# Patient Record
Sex: Male | Born: 1960 | Race: White | Hispanic: No | Marital: Married | Smoking: Never smoker
Health system: Southern US, Community
[De-identification: ages and names within clinical notes are randomized; demographics above are authoritative.]

## PROBLEM LIST (undated history)

## (undated) HISTORY — PX: WISDOM TOOTH EXTRACTION: SHX21

---

## 2012-05-10 ENCOUNTER — Encounter: Payer: Self-pay | Admitting: Physician Assistant

## 2012-05-10 ENCOUNTER — Ambulatory Visit (INDEPENDENT_AMBULATORY_CARE_PROVIDER_SITE_OTHER): Payer: BC Managed Care – PPO | Admitting: Internal Medicine

## 2012-05-10 VITALS — BP 133/76 | HR 63 | Temp 97.2°F | Resp 16 | Ht 68.0 in | Wt 163.8 lb

## 2012-05-10 DIAGNOSIS — H01119 Allergic dermatitis of unspecified eye, unspecified eyelid: Secondary | ICD-10-CM

## 2012-05-10 DIAGNOSIS — Z0000 Encounter for general adult medical examination without abnormal findings: Secondary | ICD-10-CM

## 2012-05-10 LAB — CBC WITH DIFFERENTIAL/PLATELET
Basophils Relative: 0 % (ref 0–1)
Eosinophils Absolute: 0.2 10*3/uL (ref 0.0–0.7)
Eosinophils Relative: 3 % (ref 0–5)
HCT: 44.9 % (ref 39.0–52.0)
Hemoglobin: 16.3 g/dL (ref 13.0–17.0)
MCH: 31.8 pg (ref 26.0–34.0)
MCHC: 36.3 g/dL — ABNORMAL HIGH (ref 30.0–36.0)
Monocytes Absolute: 0.7 10*3/uL (ref 0.1–1.0)
Monocytes Relative: 12 % (ref 3–12)

## 2012-05-10 LAB — POCT URINALYSIS DIPSTICK
Bilirubin, UA: NEGATIVE
Ketones, UA: NEGATIVE
Leukocytes, UA: NEGATIVE
pH, UA: 5.5

## 2012-05-10 LAB — LIPID PANEL
Cholesterol: 184 mg/dL (ref 0–200)
HDL: 42 mg/dL (ref >39)
Total CHOL/HDL Ratio: 4.4 Ratio

## 2012-05-10 LAB — POCT UA - MICROSCOPIC ONLY
Bacteria, U Microscopic: NEGATIVE
Casts, Ur, LPF, POC: NEGATIVE
Mucus, UA: NEGATIVE
WBC, Ur, HPF, POC: NEGATIVE

## 2012-05-10 LAB — COMPREHENSIVE METABOLIC PANEL
Alkaline Phosphatase: 53 U/L (ref 39–117)
BUN: 16 mg/dL (ref 6–23)
Glucose, Bld: 90 mg/dL (ref 70–99)
Total Bilirubin: 0.7 mg/dL (ref 0.3–1.2)

## 2012-05-10 LAB — TSH: TSH: 2.041 u[IU]/mL (ref 0.350–4.500)

## 2012-05-10 NOTE — Progress Notes (Signed)
  Subjective:    Patient ID: Gabriel Mills, male    DOB: 06/05/1961, 51 y.o.   MRN: [id number]  HPI Healthy Needs colonoscopy, prostate exam Left eyelid not right  Review of Systems     Objective:   Physical Exam  Constitutional: He is oriented to person, place, and time. He appears well-developed and well-nourished.  HENT:  Right Ear: External ear normal.  Left Ear: External ear normal.  Nose: Nose normal.  Mouth/Throat: Oropharynx is clear and moist.  Eyes: EOM are normal. Pupils are equal, round, and reactive to light. Right eye exhibits no discharge. Left eye exhibits no discharge. No scleral icterus.       Left lid elevation, swelling  Neck: Normal range of motion. Neck supple. No thyromegaly present.  Cardiovascular: Normal rate and normal heart sounds.   Pulmonary/Chest: Effort normal and breath sounds normal.  Abdominal: Soft. Bowel sounds are normal. He exhibits no mass. There is no tenderness.  Genitourinary: Rectum normal, prostate normal and penis normal.  Musculoskeletal: Normal range of motion.  Lymphadenopathy:    He has no cervical adenopathy.  Neurological: He is alert and oriented to person, place, and time. He has normal reflexes. Coordination normal.  Skin: Skin is warm and dry.  Psychiatric: He has a normal mood and affect. His behavior is normal. Judgment and thought content normal.   Labs and ekg       Assessment & Plan:  Refer to Dr. Dione Booze for eyelid left problem Refer for colonocopy

## 2012-05-11 ENCOUNTER — Encounter: Payer: Self-pay | Admitting: Internal Medicine

## 2012-07-30 ENCOUNTER — Encounter: Payer: Self-pay | Admitting: Family Medicine

## 2013-01-08 ENCOUNTER — Encounter: Payer: Self-pay | Admitting: Family Medicine

## 2013-01-08 ENCOUNTER — Ambulatory Visit (INDEPENDENT_AMBULATORY_CARE_PROVIDER_SITE_OTHER): Payer: BC Managed Care – PPO | Admitting: Family Medicine

## 2013-01-08 ENCOUNTER — Ambulatory Visit: Payer: BC Managed Care – PPO

## 2013-01-08 VITALS — BP 136/82 | HR 79 | Temp 97.9°F | Resp 18 | Ht 68.5 in | Wt 164.0 lb

## 2013-01-08 DIAGNOSIS — Z23 Encounter for immunization: Secondary | ICD-10-CM

## 2013-01-08 DIAGNOSIS — S6991XA Unspecified injury of right wrist, hand and finger(s), initial encounter: Secondary | ICD-10-CM

## 2013-01-08 DIAGNOSIS — S6990XA Unspecified injury of unspecified wrist, hand and finger(s), initial encounter: Secondary | ICD-10-CM

## 2013-01-08 DIAGNOSIS — S62639A Displaced fracture of distal phalanx of unspecified finger, initial encounter for closed fracture: Secondary | ICD-10-CM

## 2013-01-08 DIAGNOSIS — M79609 Pain in unspecified limb: Secondary | ICD-10-CM

## 2013-01-08 DIAGNOSIS — S6980XA Other specified injuries of unspecified wrist, hand and finger(s), initial encounter: Secondary | ICD-10-CM

## 2013-01-08 DIAGNOSIS — S61209A Unspecified open wound of unspecified finger without damage to nail, initial encounter: Secondary | ICD-10-CM

## 2013-01-08 MED ORDER — TETANUS-DIPHTH-ACELL PERTUSSIS 5-2.5-18.5 LF-MCG/0.5 IM SUSP: 0.5000 mL | Freq: Once | INTRAMUSCULAR | Status: DC

## 2013-01-08 NOTE — Progress Notes (Signed)
Procedure Note: Verbal consent obtained from the patient.  Metacarpal block with 3 cc Lidocaine 2% without epinephrine + 0.5% Marcaine in a 1:1 ratio.  Wound scrubbed with soap and water.  Wound explored.  No foreign bodies or deep structure injury noted.  Wound closed with 11 simple interrupted sutures of 5-0 ethilon.  Area cleansed and dressed.  Ulnar gutter splint applied.

## 2013-01-08 NOTE — Progress Notes (Signed)
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 He is having moderate amount pain the present time and the bleeding has stopped although he does have a laceration proximal to the cuticle of the left ring finger. Patient is left handed  Objective: Patient in no acute distress holding ice over his left hand.  Inspection of the wound reveals a laceration 3-4 mm proximal to the cuticle and encircling the finger leaving approximately 8 cm intact on the volar surface. Range of motion is compromised because of pain.  There is some swelling and slight angulation of the distal phalanx. There is a small subungual hematoma as well.  [facility] reading (PRIMARY) by  Dr. Milus Glazier:  fx distal tuft of right ring finger.  Assessment:  Fx/laceration right fourth finger  Plan:   Pain meds Follow up 2-3 days Ulnar gutter spling

## 2013-01-10 ENCOUNTER — Ambulatory Visit (INDEPENDENT_AMBULATORY_CARE_PROVIDER_SITE_OTHER): Payer: BC Managed Care – PPO | Admitting: Physician Assistant

## 2013-01-10 VITALS — BP 128/82 | HR 85 | Temp 98.1°F | Resp 17 | Ht 69.0 in | Wt 165.0 lb

## 2013-01-10 DIAGNOSIS — Z5189 Encounter for other specified aftercare: Secondary | ICD-10-CM

## 2013-01-10 DIAGNOSIS — S61209D Unspecified open wound of unspecified finger without damage to nail, subsequent encounter: Secondary | ICD-10-CM

## 2013-01-10 DIAGNOSIS — S62639D Displaced fracture of distal phalanx of unspecified finger, subsequent encounter for fracture with routine healing: Secondary | ICD-10-CM

## 2013-01-10 DIAGNOSIS — S5290XD Unspecified fracture of unspecified forearm, subsequent encounter for closed fracture with routine healing: Secondary | ICD-10-CM

## 2013-01-10 NOTE — Progress Notes (Signed)
  Subjective:    Patient ID: Gabriel Mills, male    DOB: [birth year], 52 y.o.   MRN: [id number]  HPI  Gabriel Mills is a very pleasant 52 yr old male here for follow up on a finger injury.  See previous note.  Pt states he is doing well.  Minimal pain, but states that the cast feels tight.  He leaves today to go to [place] for business.     Review of Systems  Musculoskeletal: Negative for arthralgias.  Skin: Positive for wound.  All other systems reviewed and are negative.       Objective:   Physical Exam  Vitals reviewed. Constitutional: He is oriented to person, place, and time. He appears well-developed and well-nourished. No distress.  Eyes: Conjunctivae are normal. No scleral icterus.  Pulmonary/Chest: Effort normal.  Musculoskeletal:       Hands: Right 4th finger with healing laceration; sutures in place; minimal swelling; area is macerated to dressing and splint  Neurological: He is alert and oriented to person, place, and time.  Skin: Skin is warm and dry.  Psychiatric: He has a normal mood and affect. His behavior is normal.          Assessment & Plan:  Closed fracture of distal phalanx or phalanges of hand, with routine healing, subsequent encounter  Wound, open, finger, subsequent encounter   Gabriel Mills is a very pleasant 52 yr old male here for follow up on finger injury sustained two days ago.  Pt is doing quite well, no pain.  Ulnar gutter splint was removed.  The wound had continued to bleed causing the dressing to harden, and we consequently had to soak the dressing to remove it.  The finger is macerated due to the bleeding, but otherwise looks very good.  We have redressed the area and applied a fold over splint.  Pt will keep splint in place the majority of the time, though I think it is ok to leave open to air occasionally to prevent maceration.  Daily dressing changes.  Will have pt return the early part of next week for suture removal.  Pt will let us know if  any concerns arise before then.

## 2013-01-10 NOTE — Patient Instructions (Addendum)
Daily dressing changes.  Keep the splint in place the majority of the time, but ok to leave open to air if you are just relaxing at home.  Come back early next week for suture removal, sooner if any concerns

## 2013-01-19 ENCOUNTER — Ambulatory Visit (INDEPENDENT_AMBULATORY_CARE_PROVIDER_SITE_OTHER): Payer: BC Managed Care – PPO | Admitting: Physician Assistant

## 2013-01-19 VITALS — BP 128/80 | HR 82 | Temp 98.9°F | Resp 16 | Ht 69.0 in | Wt 165.0 lb

## 2013-01-19 DIAGNOSIS — Z4802 Encounter for removal of sutures: Secondary | ICD-10-CM

## 2013-01-19 NOTE — Progress Notes (Signed)
  Subjective:    Patient ID: Gabriel Mills, male    DOB: 09-Apr-1961, 52 y.o.   MRN: [id number]  HPI   Gabriel Mills is a very pleasant 52 yr old male here for removal of sutures placed here about 10 days ago.  See previous note.  Pt states he is doing well.  States the wound continued to ooze for almost a week after sutures were placed, but has since stopped.  No redness, swelling, drainage.  No fevers or chills.  Continues to wear fold-over splint as directed.      Review of Systems  Skin: Positive for wound (healing, right 4th finger).  All other systems reviewed and are negative.       Objective:   Physical Exam  Vitals reviewed. Constitutional: He is oriented to person, place, and time. He appears well-developed and well-nourished. No distress.  HENT:  Head: Normocephalic and atraumatic.  Eyes: Conjunctivae are normal. No scleral icterus.  Pulmonary/Chest: Effort normal.  Musculoskeletal:       Right hand: He exhibits tenderness and laceration (healing). He exhibits normal range of motion, no bony tenderness and normal capillary refill.       Hands: Neurological: He is alert and oriented to person, place, and time.  Skin: Skin is warm and dry.  Psychiatric: He has a normal mood and affect. His behavior is normal.     Filed Vitals:   01/19/13 1505  BP: 128/80  Pulse: 82  Temp: 98.9 F (37.2 C)  Resp: 16        Assessment & Plan:  Visit for suture removal   Gabriel Mills is a very pleasant 52 yr old male here for removal of sutures.  Many of the sutures were buried within scab around the wound, so we consequently had to soak the finger to remove the sutures.  Eleven sutures were removed without difficulty.  The wound is closed and is well approximated.  The nail is still intact, though there is a subungal hematoma.  Will continue fold over splint for at least the next 3 weeks.  No need for further follow up unless new concerns arise.

## 2013-11-24 ENCOUNTER — Ambulatory Visit (INDEPENDENT_AMBULATORY_CARE_PROVIDER_SITE_OTHER): Payer: BC Managed Care – PPO | Admitting: Family Medicine

## 2013-11-24 ENCOUNTER — Encounter: Payer: Self-pay | Admitting: Family Medicine

## 2013-11-24 VITALS — BP 130/90 | HR 61 | Temp 97.6°F | Resp 16 | Ht 68.5 in | Wt 165.0 lb

## 2013-11-24 DIAGNOSIS — Z0000 Encounter for general adult medical examination without abnormal findings: Secondary | ICD-10-CM

## 2013-11-24 DIAGNOSIS — Z1211 Encounter for screening for malignant neoplasm of colon: Secondary | ICD-10-CM

## 2013-11-24 LAB — CBC WITH DIFFERENTIAL/PLATELET
Basophils Absolute: 0 10*3/uL (ref 0.0–0.1)
Basophils Relative: 0 % (ref 0–1)
Eosinophils Absolute: 0.1 10*3/uL (ref 0.0–0.7)
Eosinophils Relative: 3 % (ref 0–5)
HCT: 45.8 % (ref 39.0–52.0)
Hemoglobin: 16.1 g/dL (ref 13.0–17.0)
Lymphocytes Relative: 34 % (ref 12–46)
Lymphs Abs: 1.6 10*3/uL (ref 0.7–4.0)
MCH: 29.9 pg (ref 26.0–34.0)
MCHC: 35.2 g/dL (ref 30.0–36.0)
MCV: 85.1 fL (ref 78.0–100.0)
Monocytes Absolute: 0.6 10*3/uL (ref 0.1–1.0)
Monocytes Relative: 13 % — ABNORMAL HIGH (ref 3–12)
Neutro Abs: 2.4 10*3/uL (ref 1.7–7.7)
Neutrophils Relative %: 50 % (ref 43–77)
Platelets: 153 10*3/uL (ref 150–400)
RBC: 5.38 MIL/uL (ref 4.22–5.81)
RDW: 13.9 % (ref 11.5–15.5)
WBC: 4.8 10*3/uL (ref 4.0–10.5)

## 2013-11-24 LAB — POCT URINALYSIS DIPSTICK
Bilirubin, UA: NEGATIVE
Blood, UA: NEGATIVE
Glucose, UA: NEGATIVE
Ketones, UA: NEGATIVE
Leukocytes, UA: NEGATIVE
Nitrite, UA: NEGATIVE
Protein, UA: NEGATIVE
Spec Grav, UA: 1.02
Urobilinogen, UA: 0.2
pH, UA: 5.5

## 2013-11-24 LAB — LIPID PANEL
Cholesterol: 212 mg/dL — ABNORMAL HIGH (ref 0–200)
HDL: 53 mg/dL (ref >39)
LDL Cholesterol: 131 mg/dL — ABNORMAL HIGH (ref 0–99)
Total CHOL/HDL Ratio: 4 Ratio
Triglycerides: 140 mg/dL (ref <150)
VLDL: 28 mg/dL (ref 0–40)

## 2013-11-24 LAB — COMPREHENSIVE METABOLIC PANEL
ALT: 18 U/L (ref 0–53)
AST: 25 U/L (ref 0–37)
Albumin: 4.5 g/dL (ref 3.5–5.2)
Alkaline Phosphatase: 50 U/L (ref 39–117)
BUN: 15 mg/dL (ref 6–23)
CO2: 27 mEq/L (ref 19–32)
Calcium: 9.6 mg/dL (ref 8.4–10.5)
Chloride: 101 mEq/L (ref 96–112)
Creat: 1.2 mg/dL (ref 0.50–1.35)
Glucose, Bld: 102 mg/dL — ABNORMAL HIGH (ref 70–99)
Potassium: 4.5 mEq/L (ref 3.5–5.3)
Sodium: 136 mEq/L (ref 135–145)
Total Bilirubin: 0.8 mg/dL (ref 0.2–1.2)
Total Protein: 7.7 g/dL (ref 6.0–8.3)

## 2013-11-24 LAB — PSA: PSA: 0.97 ng/mL (ref <=4.00)

## 2013-11-24 LAB — IFOBT (OCCULT BLOOD): IFOBT: NEGATIVE

## 2013-11-24 NOTE — Patient Instructions (Signed)

## 2013-11-24 NOTE — Progress Notes (Signed)
Subjective:  This chart was scribed for Gabriel Sidle, MD by Carl Best, Medical Scribe. This patient was seen in Room 22 and the patient's care was started at 9:30 AM.   Patient ID: Gabriel Mills, male    DOB: [birth year], 53 y.o.   MRN: [id number]  HPI HPI Comments: Gabriel Mills is a 53 y.o. male who presents to the Emergency Department needing an annual exam.  The patient states that he exercises five days a week at [facility].  He states that will run or walk with his wife four days a week.  The patient denies experiencing any problems with his feet while exercising.  He states that he has 3 children who are 25, 23, and 19.  He states that his mother and father are still alive and healthy.  The patient states that he had a colonoscopy in 2013 and a TDAP in 2014.  The patient states that he received a flu shot this year.  The patient states that his dentist is Dr. Tyrell Antonio.  He denies experiencing any new medical problems.  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     No past medical history on file. Past Surgical History  Procedure Laterality Date   Wisdom tooth extraction     Family History  Problem Relation Age of Onset   Cancer Mother    Cancer Father    History   Social History   Marital Status: Married    Spouse Name: N/A    Number of Children: N/A   Years of Education: N/A   Occupational History   Not on file.   Social History Main Topics   Smoking status: Never Smoker    Smokeless tobacco: Not on file   Alcohol Use: Yes     Comment: BEER AND WINE   Drug Use: Not on file   Sexual Activity: Not on file   Other Topics Concern   Not on file   Social History Narrative   No narrative on file   No Known Allergies  Review of Systems  All other systems reviewed and are negative.     Objective:  Physical Exam  Nursing note and vitals reviewed. Constitutional: He is oriented to person, place, and time. He  appears well-developed and well-nourished. No distress.  HENT:  Head: Normocephalic and atraumatic.  Right Ear: External ear normal.  Left Ear: External ear normal.  Mouth/Throat: Oropharynx is clear and moist. No oropharyngeal exudate.  Eyes: Conjunctivae and EOM are normal. Pupils are equal, round, and reactive to light. Right eye exhibits no discharge. Left eye exhibits no discharge. No scleral icterus.  Neck: Normal range of motion. Neck supple. No JVD present. No tracheal deviation present. No thyromegaly present.  Cardiovascular: Normal rate, regular rhythm, normal heart sounds and intact distal pulses.  Exam reveals no gallop and no friction rub.   No murmur heard. Pulmonary/Chest: Effort normal. No stridor. No respiratory distress. He has no wheezes. He has no rales.  Abdominal: Soft. Bowel sounds are normal. He exhibits no distension and no mass. There is no tenderness. There is no rebound and no guarding.  Genitourinary: Rectum normal, prostate normal and penis normal. No penile tenderness.  Musculoskeletal: Normal range of motion. He exhibits no edema and no tenderness.  Lymphadenopathy:    He has no cervical adenopathy.  Neurological: He is alert and oriented to person, place, and time. No cranial nerve deficit. He exhibits normal muscle tone. Coordination normal.  Skin:  Skin is warm and dry. No rash noted.  Psychiatric: He has a normal mood and affect. His behavior is normal. Judgment and thought content normal.   Results for orders placed in visit on 11/24/13  POCT URINALYSIS DIPSTICK      Result Value Range   Color, UA yellow     Clarity, UA clear     Glucose, UA neg     Bilirubin, UA neg     Ketones, UA neg     Spec Grav, UA 1.020     Blood, UA neg     pH, UA 5.5     Protein, UA neg     Urobilinogen, UA 0.2     Nitrite, UA neg     Leukocytes, UA Negative    IFOBT (OCCULT BLOOD)      Result Value Range   IFOBT Negative         BP 130/90   Pulse 61   Temp(Src)  97.6 F (36.4 C) (Oral)   Resp 16   Ht 5' 8.5" (1.74 m)   Wt 165 lb (74.844 kg)   BMI 24.72 kg/m2   SpO2 100% Assessment & Plan:    I personally performed the services described in this documentation, which was scribed in my presence. The recorded information has been reviewed and is accurate.  Patient seems to be in excellent health. His social and family relationships or solid. Sleeping well. He is exercising regularly. He's basically up-to-date on all his preventive care. He is certainly able to wait a year for another physical pending lab results.  Annual physical exam - Plan: CBC with Differential, Lipid panel, PSA, Comprehensive metabolic panel, POCT urinalysis dipstick  Colon cancer screening - Plan: IFOBT POC (occult bld, rslt in office)  Signed, Gabriel SidleKurt Lauenstein, MD

## 2013-11-24 NOTE — Progress Notes (Signed)
   Subjective:    Patient ID: Gabriel DredgeKevin Friley, male    DOB: Jun 11, 1961, 53 y.o.   MRN: [id number]  HPI    Review of Systems  Constitutional: Negative.   HENT: Negative.   Eyes: Negative.   Respiratory: Negative.   Cardiovascular: Negative.   Gastrointestinal: Negative.   Endocrine: Negative.   Genitourinary: Negative.   Musculoskeletal: Negative.   Skin: Negative.   Allergic/Immunologic: Negative.   Neurological: Negative.   Hematological: Negative.   Psychiatric/Behavioral: Negative.        Objective:   Physical Exam        Assessment & Plan:

## 2014-01-17 ENCOUNTER — Encounter: Payer: Self-pay | Admitting: Emergency Medicine

## 2014-05-14 IMAGING — CR DG FINGER RING 2+V*R*
1 series · 1 of 1 positions shown · non-contrast
Comparison: None.

CLINICAL DATA: Fourth finger injury

RIGHT RING FINGER 2+V

[PA]
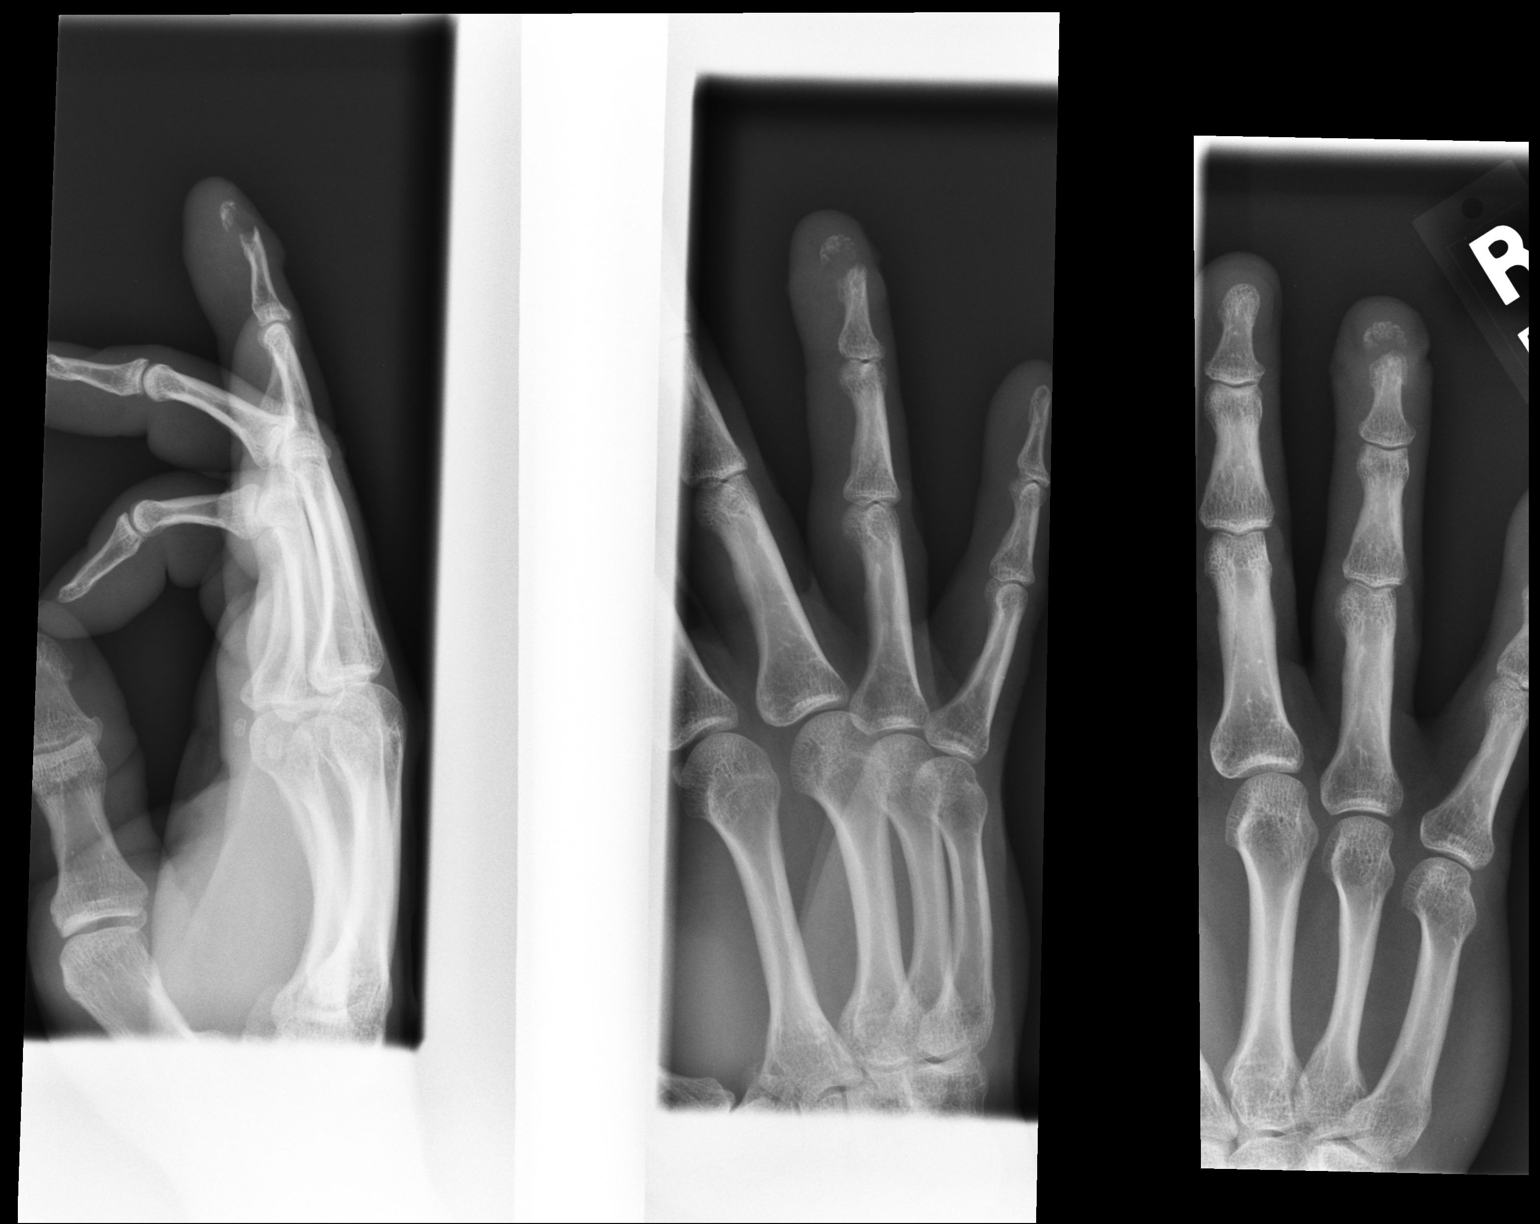

[1 of 1 positions shown; findings below may reference images not displayed]

FINDINGS: Three views of the right fourth finger submitted.  There
is displaced fracture of the tip of distal phalanx right fourth
finger.
IMPRESSION: Displaced fracture of the tip of distal phalanx right fourth
finger.

## 2014-11-20 HISTORY — PX: OTHER SURGICAL HISTORY: SHX169

## 2014-11-30 ENCOUNTER — Encounter: Payer: BC Managed Care – PPO | Admitting: Family Medicine

## 2015-11-30 ENCOUNTER — Telehealth: Payer: Self-pay | Admitting: Family Medicine

## 2015-11-30 NOTE — Telephone Encounter (Signed)
lmom that patient appointment has been cancelled with Gabriel Mills on 01-10-2016 please to reschedule

## 2016-01-10 ENCOUNTER — Encounter: Payer: Self-pay | Admitting: Urgent Care

## 2016-01-14 ENCOUNTER — Encounter: Payer: Self-pay | Admitting: Family Medicine

## 2016-09-24 ENCOUNTER — Ambulatory Visit (INDEPENDENT_AMBULATORY_CARE_PROVIDER_SITE_OTHER): Payer: 59 | Admitting: Family Medicine

## 2016-09-24 ENCOUNTER — Encounter: Payer: Self-pay | Admitting: Family Medicine

## 2016-09-24 VITALS — BP 112/68 | HR 68 | Temp 98.0°F | Resp 16 | Ht 68.0 in | Wt 165.2 lb

## 2016-09-24 DIAGNOSIS — Z23 Encounter for immunization: Secondary | ICD-10-CM

## 2016-09-24 DIAGNOSIS — Z0000 Encounter for general adult medical examination without abnormal findings: Secondary | ICD-10-CM | POA: Diagnosis not present

## 2016-09-24 DIAGNOSIS — Z125 Encounter for screening for malignant neoplasm of prostate: Secondary | ICD-10-CM | POA: Diagnosis not present

## 2016-09-24 DIAGNOSIS — E7800 Pure hypercholesterolemia, unspecified: Secondary | ICD-10-CM

## 2016-09-24 DIAGNOSIS — Z1159 Encounter for screening for other viral diseases: Secondary | ICD-10-CM | POA: Diagnosis not present

## 2016-09-24 DIAGNOSIS — Z114 Encounter for screening for human immunodeficiency virus [HIV]: Secondary | ICD-10-CM

## 2016-09-24 DIAGNOSIS — R739 Hyperglycemia, unspecified: Secondary | ICD-10-CM

## 2016-09-24 LAB — POCT URINALYSIS DIP (MANUAL ENTRY)
Bilirubin, UA: NEGATIVE
GLUCOSE UA: NEGATIVE
Ketones, POC UA: NEGATIVE
Leukocytes, UA: NEGATIVE
NITRITE UA: NEGATIVE
PH UA: 5.5
PROTEIN UA: NEGATIVE
RBC UA: NEGATIVE
Spec Grav, UA: 1.02
UROBILINOGEN UA: 0.2

## 2016-09-24 NOTE — Progress Notes (Signed)
Subjective:    Patient ID: Gabriel Mills, male    DOB: Mar 22, 1961, 55 y.o.   MRN: [id number]  09/24/2016  Annual Exam   HPI This 55 y.o. male presents for Complete Physical Examination.  Last physical: 11-24-2013 Colonoscopy:  [place] 2015.   Eye exam: 2 years ago; 2015; CSR/retinopathy L.     Immunization History  Administered Date(s) Administered  . Influenza,inj,Quad PF,36+ Mos 09/24/2016  . Influenza-Unspecified 07/20/2013, 09/24/2016  . Tdap 01/08/2013   Wt Readings from Last 3 Encounters:  09/24/16 165 lb 3.2 oz (74.9 kg)  11/24/13 165 lb (74.8 kg)  01/19/13 165 lb (74.8 kg)   BP Readings from Last 3 Encounters:  09/24/16 112/68  11/24/13 130/90  01/19/13 128/80     Review of Systems  Constitutional: Negative for activity change, appetite change, chills, diaphoresis, fatigue, fever and unexpected weight change.  HENT: Negative for congestion, dental problem, drooling, ear discharge, ear pain, facial swelling, hearing loss, mouth sores, nosebleeds, postnasal drip, rhinorrhea, sinus pressure, sneezing, sore throat, tinnitus, trouble swallowing and voice change.   Eyes: Negative for photophobia, pain, discharge, redness, itching and visual disturbance.  Respiratory: Negative for apnea, cough, choking, chest tightness, shortness of breath, wheezing and stridor.   Cardiovascular: Negative for chest pain, palpitations and leg swelling.  Gastrointestinal: Negative for abdominal pain, blood in stool, constipation, diarrhea, nausea and vomiting.  Endocrine: Negative for cold intolerance, heat intolerance, polydipsia, polyphagia and polyuria.  Genitourinary: Negative for decreased urine volume, difficulty urinating, discharge, dysuria, enuresis, flank pain, frequency, genital sores, hematuria, penile pain, penile swelling, scrotal swelling, testicular pain and urgency.       Nocturia x 0; decreased urinary stream.   Musculoskeletal: Negative for arthralgias, back pain,  gait problem, joint swelling, myalgias, neck pain and neck stiffness.  Skin: Negative for color change, pallor, rash and wound.  Allergic/Immunologic: Negative for environmental allergies, food allergies and immunocompromised state.  Neurological: Negative for dizziness, tremors, seizures, syncope, facial asymmetry, speech difficulty, weakness, light-headedness, numbness and headaches.  Hematological: Negative for adenopathy. Does not bruise/bleed easily.  Psychiatric/Behavioral: Negative for agitation, behavioral problems, confusion, decreased concentration, dysphoric mood, hallucinations, self-injury, sleep disturbance and suicidal ideas. The patient is not nervous/anxious and is not hyperactive.     No past medical history on file. Past Surgical History:  Procedure Laterality Date  . rotator cuff surgery R  11/20/2014   [place]  . WISDOM TOOTH EXTRACTION     No Known Allergies Current Outpatient Prescriptions  Medication Sig Dispense Refill  . Glucosamine-Chondroit-Vit C-Mn (GLUCOSAMINE-CHONDROITIN) TABS Take by mouth daily.    . Multiple Vitamin (MULTIVITAMIN) tablet Take 1 tablet by mouth daily.     Current Facility-Administered Medications  Medication Dose Route Frequency Provider Last Rate Last Dose  . TDaP (BOOSTRIX) injection 0.5 mL  0.5 mL Intramuscular Once Elvina SidleKurt Lauenstein, MD       Social History   Social History  . Marital status: Married    Spouse name: N/A  . Number of children: N/A  . Years of education: N/A   Occupational History  . Engineer    Social History Main Topics  . Smoking status: Never Smoker  . Smokeless tobacco: Not on file  . Alcohol use Yes     Comment: BEER AND WINE 3 drinks  . Drug use: No  . Sexual activity: Not on file   Other Topics Concern  . Not on file   Social History Narrative   Martial status: Married x 1986  Children: 3 children (28, 26, 22); 1 grandchild.      Lives: with wife         Education: Automotive engineer.        Employment: Actuary at [employer]       Tobacco: none      Alcohol: 3 servings per week      Exercise: Yes.six days per week; rotation spring and fall weights 3 days per week and aerobics three days per week; summer aerobic six days per week; winter weights six days per week.   Family History  Problem Relation Age of Onset  . Cancer Mother 82    breast cancer  . Cancer Father 52    prostate cancer       Objective:    BP 112/68 (BP Location: Left Arm, Patient Position: Sitting, Cuff Size: Normal)   Pulse 68   Temp 98 F (36.7 C) (Oral)   Resp 16   Ht 5\' 8"  (1.727 m)   Wt 165 lb 3.2 oz (74.9 kg)   SpO2 99%   BMI 25.12 kg/m  Physical Exam  Constitutional: He is oriented to person, place, and time. He appears well-developed and well-nourished. No distress.  HENT:  Head: Normocephalic and atraumatic.  Right Ear: External ear normal.  Left Ear: External ear normal.  Nose: Nose normal.  Mouth/Throat: Oropharynx is clear and moist.  Eyes: Conjunctivae and EOM are normal. Pupils are equal, round, and reactive to light.  Neck: Normal range of motion. Neck supple. Carotid bruit is not present. No thyromegaly present.  Cardiovascular: Normal rate, regular rhythm, normal heart sounds and intact distal pulses.  Exam reveals no gallop and no friction rub.   No murmur heard. Pulmonary/Chest: Effort normal and breath sounds normal. He has no wheezes. He has no rales.  Abdominal: Soft. Bowel sounds are normal. He exhibits no distension and no mass. There is no tenderness. There is no rebound and no guarding. Hernia confirmed negative in the right inguinal area and confirmed negative in the left inguinal area.  Genitourinary: Prostate normal, testes normal and penis normal.  Musculoskeletal:       Right shoulder: Normal.       Left shoulder: Normal.       Cervical back: Normal.  Lymphadenopathy:    He has no cervical adenopathy.       Right: No inguinal adenopathy present.        Left: No inguinal adenopathy present.  Neurological: He is alert and oriented to person, place, and time. He has normal reflexes. No cranial nerve deficit. He exhibits normal muscle tone. Coordination normal.  Skin: Skin is warm and dry. No rash noted. He is not diaphoretic.  Psychiatric: He has a normal mood and affect. His behavior is normal. Judgment and thought content normal.   Results for orders placed or performed in visit on 09/24/16  CBC with Differential/Platelet  Result Value Ref Range   WBC 4.7 3.4 - 10.8 x10E3/uL   RBC 5.09 4.14 - 5.80 x10E6/uL   Hemoglobin 15.7 13.0 - 17.7 g/dL   Hematocrit 16.1 09.6 - 51.0 %   MCV 91 79 - 97 fL   MCH 30.8 26.6 - 33.0 pg   MCHC 34.0 31.5 - 35.7 g/dL   RDW 04.5 40.9 - 81.1 %   Platelets 234 150 - 379 x10E3/uL   Neutrophils 54 Not Estab. %   Lymphs 32 Not Estab. %   Monocytes 12 Not Estab. %   Eos 2 Not Estab. %  Basos 0 Not Estab. %   Neutrophils Absolute 2.5 1.4 - 7.0 x10E3/uL   Lymphocytes Absolute 1.5 0.7 - 3.1 x10E3/uL   Monocytes Absolute 0.6 0.1 - 0.9 x10E3/uL   EOS (ABSOLUTE) 0.1 0.0 - 0.4 x10E3/uL   Basophils Absolute 0.0 0.0 - 0.2 x10E3/uL   Immature Granulocytes 0 Not Estab. %   Immature Grans (Abs) 0.0 0.0 - 0.1 x10E3/uL  Comprehensive metabolic panel  Result Value Ref Range   Glucose 91 65 - 99 mg/dL   BUN 19 6 - 24 mg/dL   Creatinine, Ser 1.61 0.76 - 1.27 mg/dL   GFR calc non Af Amer 66 >59 mL/min/1.73   GFR calc Af Amer 76 >59 mL/min/1.73   BUN/Creatinine Ratio 15 9 - 20   Sodium 140 134 - 144 mmol/L   Potassium 4.5 3.5 - 5.2 mmol/L   Chloride 100 96 - 106 mmol/L   CO2 25 18 - 29 mmol/L   Calcium 9.4 8.7 - 10.2 mg/dL   Total Protein 7.3 6.0 - 8.5 g/dL   Albumin 4.5 3.5 - 5.5 g/dL   Globulin, Total 2.8 1.5 - 4.5 g/dL   Albumin/Globulin Ratio 1.6 1.2 - 2.2   Bilirubin Total 0.6 0.0 - 1.2 mg/dL   Alkaline Phosphatase 57 39 - 117 IU/L   AST 26 0 - 40 IU/L   ALT 18 0 - 44 IU/L  Lipid panel  Result Value Ref  Range   Cholesterol, Total 202 (H) 100 - 199 mg/dL   Triglycerides 99 0 - 149 mg/dL   HDL 49 >09 mg/dL   VLDL Cholesterol Cal 20 5 - 40 mg/dL   LDL Calculated 604 (H) 0 - 99 mg/dL   Chol/HDL Ratio 4.1 0.0 - 5.0 ratio units  PSA  Result Value Ref Range   Prostate Specific Ag, Serum 1.3 0.0 - 4.0 ng/mL  TSH  Result Value Ref Range   TSH 1.760 0.450 - 4.500 uIU/mL  Hemoglobin A1c  Result Value Ref Range   Hgb A1c MFr Bld 5.2 4.8 - 5.6 %   Est. average glucose Bld gHb Est-mCnc 103 mg/dL  HIV antibody  Result Value Ref Range   HIV Screen 4th Generation wRfx Non Reactive Non Reactive  Hepatitis C antibody  Result Value Ref Range   Hep C Virus Ab <0.1 0.0 - 0.9 s/co ratio  POCT urinalysis dipstick  Result Value Ref Range   Color, UA yellow yellow   Clarity, UA clear clear   Glucose, UA negative negative   Bilirubin, UA negative negative   Ketones, POC UA negative negative   Spec Grav, UA 1.020    Blood, UA negative negative   pH, UA 5.5    Protein Ur, POC negative negative   Urobilinogen, UA 0.2    Nitrite, UA Negative Negative   Leukocytes, UA Negative Negative   Depression screen PHQ 2/9 09/24/2016  Decreased Interest 0  Down, Depressed, Hopeless 0  PHQ - 2 Score 0   Fall Risk  09/24/2016  Falls in the past year? No       Assessment & Plan:   1. Routine physical examination   2. Pure hypercholesterolemia   3. Hyperglycemia   4. Screening for prostate cancer   5. Screening for HIV (human immunodeficiency virus)   6. Encounter for hepatitis C screening test for low risk patient   7. Need for prophylactic vaccination and inoculation against influenza     Orders Placed This Encounter  Procedures  . Flu Vaccine QUAD  36+ mos IM  . CBC with Differential/Platelet  . Comprehensive metabolic panel    Order Specific Question:   Has the patient fasted?    Answer:   Yes  . Lipid panel    Order Specific Question:   Has the patient fasted?    Answer:   Yes  . PSA  .  TSH  . Hemoglobin A1c  . HIV antibody  . Hepatitis C antibody  . POCT urinalysis dipstick   No orders of the defined types were placed in this encounter.   No Follow-up on file.   Kristi Paulita FujitaMartin Smith, M.D. [facility]  [place] [address] [place], [place]  [id number] [phone] phone [phone]) [phone] fax

## 2016-09-24 NOTE — Patient Instructions (Addendum)
   IF you received an x-ray today, you will receive an invoice from [facility]. Please contact [facility] at [phone] with questions or concerns regarding your invoice.   IF you received labwork today, you will receive an invoice from [facility]. Please contact [facility] at [phone] with questions or concerns regarding your invoice.   Our billing staff will not be able to assist you with questions regarding bills from these companies.  You will be contacted with the lab results as soon as they are available. The fastest way to get your results is to activate your My Chart account. Instructions are located on the last page of this paperwork. If you have not heard from us regarding the results in 2 weeks, please contact this office.    Keeping you healthy  Get these tests  Blood pressure- Have your blood pressure checked once a year by your healthcare provider.  Normal blood pressure is 120/80  Weight- Have your body mass index (BMI) calculated to screen for obesity.  BMI is a measure of body fat based on height and weight. You can also calculate your own BMI at www.[place].com/bmi/.  Cholesterol- Have your cholesterol checked every year.  Diabetes- Have your blood sugar checked regularly if you have high blood pressure, high cholesterol, have a family history of diabetes or if you are overweight.  Screening for Colon Cancer- Colonoscopy starting at age 50.  Screening may begin sooner depending on your family history and other health conditions. Follow up colonoscopy as directed by your Gastroenterologist.  Screening for Prostate Cancer- Both blood work (PSA) and a rectal exam help screen for Prostate Cancer.  Screening begins at age 40 with African-American men and at age 50 with Caucasian men.  Screening may begin sooner depending on your family history.  Take these medicines  Aspirin- One aspirin daily can help prevent Heart  disease and Stroke.  Flu shot- Every fall.  Tetanus- Every 10 years.  Zostavax- Once after the age of 60 to prevent Shingles.  Pneumonia shot- Once after the age of 65; if you are younger than 65, ask your healthcare provider if you need a Pneumonia shot.  Take these steps  Don't smoke- If you do smoke, talk to your doctor about quitting.  For tips on how to quit, go to www.smokefree.gov or call 1-800-QUIT-NOW.  Be physically active- Exercise 5 days a week for at least 30 minutes.  If you are not already physically active start slow and gradually work up to 30 minutes of moderate physical activity.  Examples of moderate activity include walking briskly, mowing the yard, dancing, swimming, bicycling, etc.  Eat a healthy diet- Eat a variety of healthy food such as fruits, vegetables, low fat milk, low fat cheese, yogurt, lean meant, poultry, fish, beans, tofu, etc. For more information go to www.thenutritionsource.org  Drink alcohol in moderation- Limit alcohol intake to less than two drinks a day. Never drink and drive.  Dentist- Brush and floss twice daily; visit your dentist twice a year.  Depression- Your emotional health is as important as your physical health. If you're feeling down, or losing interest in things you would normally enjoy please talk to your healthcare provider.  Eye exam- Visit your eye doctor every year.  Safe sex- If you may be exposed to a sexually transmitted infection, use a condom.  Seat belts- Seat belts can save your life; always wear one.  Smoke/Carbon Monoxide detectors- These detectors need to be installed on   the appropriate level of your home.  Replace batteries at least once a year.  Skin cancer- When out in the sun, cover up and use sunscreen 15 SPF or higher.  Violence- If anyone is threatening you, please tell your healthcare provider.  Living Will/ Health care power of attorney- Speak with your healthcare provider and family. 

## 2016-09-25 LAB — CBC WITH DIFFERENTIAL/PLATELET
BASOS: 0 %
Basophils Absolute: 0 10*3/uL (ref 0.0–0.2)
EOS (ABSOLUTE): 0.1 10*3/uL (ref 0.0–0.4)
EOS: 2 %
HEMATOCRIT: 46.2 % (ref 37.5–51.0)
HEMOGLOBIN: 15.7 g/dL (ref 13.0–17.7)
IMMATURE GRANS (ABS): 0 10*3/uL (ref 0.0–0.1)
Immature Granulocytes: 0 %
LYMPHS ABS: 1.5 10*3/uL (ref 0.7–3.1)
LYMPHS: 32 %
MCH: 30.8 pg (ref 26.6–33.0)
MCHC: 34 g/dL (ref 31.5–35.7)
MCV: 91 fL (ref 79–97)
MONOCYTES: 12 %
Monocytes Absolute: 0.6 10*3/uL (ref 0.1–0.9)
NEUTROS ABS: 2.5 10*3/uL (ref 1.4–7.0)
Neutrophils: 54 %
Platelets: 234 10*3/uL (ref 150–379)
RBC: 5.09 x10E6/uL (ref 4.14–5.80)
RDW: 13.6 % (ref 12.3–15.4)
WBC: 4.7 10*3/uL (ref 3.4–10.8)

## 2016-09-25 LAB — COMPREHENSIVE METABOLIC PANEL
ALBUMIN: 4.5 g/dL (ref 3.5–5.5)
ALK PHOS: 57 IU/L (ref 39–117)
ALT: 18 IU/L (ref 0–44)
AST: 26 IU/L (ref 0–40)
Albumin/Globulin Ratio: 1.6 (ref 1.2–2.2)
BILIRUBIN TOTAL: 0.6 mg/dL (ref 0.0–1.2)
BUN / CREAT RATIO: 15 (ref 9–20)
BUN: 19 mg/dL (ref 6–24)
CHLORIDE: 100 mmol/L (ref 96–106)
CO2: 25 mmol/L (ref 18–29)
CREATININE: 1.23 mg/dL (ref 0.76–1.27)
Calcium: 9.4 mg/dL (ref 8.7–10.2)
GFR calc non Af Amer: 66 mL/min/{1.73_m2} (ref >59)
GFR, EST AFRICAN AMERICAN: 76 mL/min/{1.73_m2} (ref >59)
GLOBULIN, TOTAL: 2.8 g/dL (ref 1.5–4.5)
Glucose: 91 mg/dL (ref 65–99)
Potassium: 4.5 mmol/L (ref 3.5–5.2)
SODIUM: 140 mmol/L (ref 134–144)
TOTAL PROTEIN: 7.3 g/dL (ref 6.0–8.5)

## 2016-09-25 LAB — LIPID PANEL
CHOLESTEROL TOTAL: 202 mg/dL — AB (ref 100–199)
Chol/HDL Ratio: 4.1 ratio units (ref 0.0–5.0)
HDL: 49 mg/dL (ref >39)
LDL CALC: 133 mg/dL — AB (ref 0–99)
Triglycerides: 99 mg/dL (ref 0–149)
VLDL CHOLESTEROL CAL: 20 mg/dL (ref 5–40)

## 2016-09-25 LAB — HIV ANTIBODY (ROUTINE TESTING W REFLEX): HIV Screen 4th Generation wRfx: NONREACTIVE

## 2016-09-25 LAB — HEPATITIS C ANTIBODY

## 2016-09-25 LAB — PSA: PROSTATE SPECIFIC AG, SERUM: 1.3 ng/mL (ref 0.0–4.0)

## 2016-09-25 LAB — TSH: TSH: 1.76 u[IU]/mL (ref 0.450–4.500)

## 2016-09-25 LAB — HEMOGLOBIN A1C
Est. average glucose Bld gHb Est-mCnc: 103 mg/dL
HEMOGLOBIN A1C: 5.2 % (ref 4.8–5.6)

## 2017-10-06 ENCOUNTER — Telehealth: Payer: Self-pay | Admitting: Family Medicine

## 2017-10-06 NOTE — Telephone Encounter (Signed)
Called pt to try and reschedule his appt with Dr. Katrinka BlazingSmith for 11/02/17. Dr. Katrinka BlazingSmith will not be in the office that day. I could not leave a message due to no number on his DPR.  If pt calls back, please reschedule him with Dr. Katrinka BlazingSmith any day with the exception of 11/02/17 and 11/11/17.  Thanks!

## 2017-11-02 ENCOUNTER — Encounter: Payer: 59 | Admitting: Family Medicine

## 2017-11-25 ENCOUNTER — Other Ambulatory Visit: Payer: Self-pay | Admitting: General Surgery

## 2017-12-21 ENCOUNTER — Ambulatory Visit (INDEPENDENT_AMBULATORY_CARE_PROVIDER_SITE_OTHER): Payer: 59 | Admitting: Family Medicine

## 2017-12-21 ENCOUNTER — Encounter: Payer: Self-pay | Admitting: Family Medicine

## 2017-12-21 ENCOUNTER — Other Ambulatory Visit: Payer: Self-pay

## 2017-12-21 ENCOUNTER — Encounter: Payer: 59 | Admitting: Family Medicine

## 2017-12-21 VITALS — BP 130/82 | HR 95 | Temp 98.0°F | Resp 16 | Ht 68.5 in | Wt 157.0 lb

## 2017-12-21 DIAGNOSIS — Z01818 Encounter for other preprocedural examination: Secondary | ICD-10-CM

## 2017-12-21 DIAGNOSIS — Z125 Encounter for screening for malignant neoplasm of prostate: Secondary | ICD-10-CM

## 2017-12-21 DIAGNOSIS — Z131 Encounter for screening for diabetes mellitus: Secondary | ICD-10-CM | POA: Diagnosis not present

## 2017-12-21 DIAGNOSIS — Z0000 Encounter for general adult medical examination without abnormal findings: Secondary | ICD-10-CM | POA: Diagnosis not present

## 2017-12-21 DIAGNOSIS — K4090 Unilateral inguinal hernia, without obstruction or gangrene, not specified as recurrent: Secondary | ICD-10-CM

## 2017-12-21 DIAGNOSIS — Z13220 Encounter for screening for lipoid disorders: Secondary | ICD-10-CM

## 2017-12-21 LAB — POCT URINALYSIS DIP (MANUAL ENTRY)
Bilirubin, UA: NEGATIVE
Glucose, UA: NEGATIVE mg/dL
Ketones, POC UA: NEGATIVE mg/dL
Leukocytes, UA: NEGATIVE
NITRITE UA: NEGATIVE
PROTEIN UA: NEGATIVE mg/dL
SPEC GRAV UA: 1.025 (ref 1.010–1.025)
Urobilinogen, UA: 0.2 E.U./dL
pH, UA: 5.5 (ref 5.0–8.0)

## 2017-12-21 NOTE — Progress Notes (Signed)
Subjective:    Patient ID: Gabriel Mills, male    DOB: 1960/12/02, 57 y.o.   MRN: [id number]  12/21/2017  Annual Exam    HPI This 57 y.o. male presents for Complete Physical Examination.  Last physical:  09/2016 [place]; colon polyps. PSA:  09/2016 Eye exam: none; no recent exam.  Several years. Gabriel Mills. Central serous retinopathy.  [facility] Dental exam:  Every six months.   Hernia : having hernia surgery in one week.  Then will have colonoscopy. Gabriel Mills.      Visual Acuity Screening   Right eye Left eye Both eyes  Without correction: 20/20 20/200 20/20  With correction:       BP Readings from Last 3 Encounters:  12/21/17 130/82  09/24/16 112/68  11/24/13 130/90   Wt Readings from Last 3 Encounters:  12/21/17 157 lb (71.2 kg)  09/24/16 165 lb 3.2 oz (74.9 kg)  11/24/13 165 lb (74.8 kg)   Immunization History  Administered Date(s) Administered  . Influenza,inj,Quad PF,6+ Mos 09/24/2016  . Influenza-Unspecified 07/20/2013, 09/24/2016  . Tdap 01/08/2013   Health Maintenance  Topic Date Due  . INFLUENZA VACCINE  08/23/2018 (Originally 05/20/2017)  . COLONOSCOPY  05/31/2022  . TETANUS/TDAP  01/09/2023  . Hepatitis C Screening  Completed  . HIV Screening  Completed    Review of Systems  Constitutional: Negative for activity change, appetite change, chills, diaphoresis, fatigue, fever and unexpected weight change.  HENT: Negative for congestion, dental problem, drooling, ear discharge, ear pain, facial swelling, hearing loss, mouth sores, nosebleeds, postnasal drip, rhinorrhea, sinus pressure, sneezing, sore throat, tinnitus, trouble swallowing and voice change.   Eyes: Negative for photophobia, pain, discharge, redness, itching and visual disturbance.  Respiratory: Negative for apnea, cough, choking, chest tightness, shortness of breath, wheezing and stridor.   Cardiovascular: Negative for chest pain,  palpitations and leg swelling.  Gastrointestinal: Positive for nausea. Negative for abdominal distention, abdominal pain, anal bleeding, blood in stool, constipation, diarrhea, rectal pain and vomiting.  Endocrine: Negative for cold intolerance, heat intolerance, polydipsia, polyphagia and polyuria.  Genitourinary: Negative for decreased urine volume, difficulty urinating, discharge, dysuria, enuresis, flank pain, frequency, genital sores, hematuria, penile pain, penile swelling, scrotal swelling, testicular pain and urgency.       Nocturia x 0.  Urinary stream strong; some weakness a lot.  Bladder empties well.    Musculoskeletal: Negative for arthralgias, back pain, gait problem, joint swelling, myalgias, neck pain and neck stiffness.  Skin: Negative for color change, pallor, rash and wound.  Allergic/Immunologic: Negative for environmental allergies, food allergies and immunocompromised state.  Neurological: Negative for dizziness, tremors, seizures, syncope, facial asymmetry, speech difficulty, weakness, light-headedness, numbness and headaches.  Hematological: Negative for adenopathy. Does not bruise/bleed easily.  Psychiatric/Behavioral: Negative for agitation, behavioral problems, confusion, decreased concentration, dysphoric mood, hallucinations, self-injury, sleep disturbance and suicidal ideas. The patient is not nervous/anxious and is not hyperactive.        Bedtime 1100; wakes up 700.    History reviewed. No pertinent past medical history. Past Surgical History:  Procedure Laterality Date  . rotator cuff surgery R  11/20/2014   Gabriel Mills  . WISDOM TOOTH EXTRACTION     No Known Allergies Current Outpatient Medications on File Prior to Visit  Medication Sig Dispense Refill  . Glucosamine-Chondroit-Vit C-Mn (GLUCOSAMINE-CHONDROITIN) TABS Take by mouth daily.    . Multiple Vitamin (MULTIVITAMIN) tablet Take 1 tablet by mouth daily.     No current facility-administered  medications on  file prior to visit.    Social History   Socioeconomic History  . Marital status: Married    Spouse name: Not on file  . Number of children: Not on file  . Years of education: Not on file  . Highest education level: Not on file  Social Needs  . Financial resource strain: Not on file  . Food insecurity - worry: Not on file  . Food insecurity - inability: Not on file  . Transportation needs - medical: Not on file  . Transportation needs - non-medical: Not on file  Occupational History  . Occupation: Art gallery manager  Tobacco Use  . Smoking status: Never Smoker  . Smokeless tobacco: Never Used  Substance and Sexual Activity  . Alcohol use: Yes    Comment: BEER AND WINE 3 drinks  . Drug use: No  . Sexual activity: Yes  Other Topics Concern  . Not on file  Social History Narrative   Martial status: Married x 1986      Children: 3 children (28, 5, 22); 1 grandchild. One on the way.      Lives: with wife         Education: Automotive engineer.       Employment: retired in 2019; retired from [employer].      Tobacco: none      Alcohol: 3 servings per week      Exercise: Yes.six days per week; rotation spring and fall weights 3 days per week and aerobics three days per week; summer aerobic six days per week; winter weights six days per week.      Seatbelt: 100%   Family History  Problem Relation Age of Onset  . Cancer Mother 27       breast cancer  . Cancer Father 70       prostate cancer       Objective:    BP 130/82   Pulse 95   Temp 98 F (36.7 C) (Oral)   Resp 16   Ht 5' 8.5" (1.74 m)   Wt 157 lb (71.2 kg)   SpO2 99%   BMI 23.52 kg/m  Physical Exam  Constitutional: He is oriented to person, place, and time. He appears well-developed and well-nourished. No distress.  HENT:  Head: Normocephalic and atraumatic.  Right Ear: External ear normal.  Left Ear: External ear normal.  Nose: Nose normal.  Mouth/Throat: Oropharynx is clear and moist.  Eyes: Conjunctivae and  EOM are normal. Pupils are equal, round, and reactive to light.  Neck: Normal range of motion. Neck supple. Carotid bruit is not present. No thyromegaly present.  Cardiovascular: Normal rate, regular rhythm, normal heart sounds and intact distal pulses. Exam reveals no gallop and no friction rub.  No murmur heard. Pulmonary/Chest: Effort normal and breath sounds normal. He has no wheezes. He has no rales.  Abdominal: Soft. Bowel sounds are normal. He exhibits no distension and no mass. There is no tenderness. There is no rebound and no guarding. A hernia is present. Hernia confirmed positive in the right inguinal area. Hernia confirmed negative in the left inguinal area.  Genitourinary: Rectum normal, prostate normal, testes normal and penis normal.  Musculoskeletal:       Right shoulder: Normal.       Left shoulder: Normal.       Cervical back: Normal.  Lymphadenopathy:    He has no cervical adenopathy.       Right: No inguinal adenopathy present.  Left: No inguinal adenopathy present.  Neurological: He is alert and oriented to person, place, and time. He has normal reflexes. No cranial nerve deficit. He exhibits normal muscle tone. Coordination normal.  Skin: Skin is warm and dry. No rash noted. He is not diaphoretic.  Psychiatric: He has a normal mood and affect. His behavior is normal. Judgment and thought content normal.   No results found. Depression screen [facility] 2/9 12/21/2017 09/24/2016  Decreased Interest 0 0  Down, Depressed, Hopeless 0 0  PHQ - 2 Score 0 0   Fall Risk  12/21/2017 09/24/2016  Falls in the past year? No No        Assessment & Plan:   1. Routine physical examination   2. Screening for diabetes mellitus   3. Screening, lipid   4. Screening for prostate cancer   5. Non-recurrent unilateral inguinal hernia without obstruction or gangrene   6. Preoperative clearance     -anticipatory guidance provided --- exercise, weight loss, safe driving practices,  aspirin 81mg  daily. -obtain age appropriate screening labs and labs for chronic disease management. -scheduled for hernia repair in upcoming month.  -Due for colonsocopy repeat.     Orders Placed This Encounter  Procedures  . CBC with Differential/Platelet  . Comprehensive metabolic panel    Order Specific Question:   Has the patient fasted?    Answer:   No  . Hemoglobin A1c  . Lipid panel    Order Specific Question:   Has the patient fasted?    Answer:   No  . PSA  . TSH  . POCT urinalysis dipstick  . EKG 12-Lead   No orders of the defined types were placed in this encounter.   No Follow-up on file.   Rashauna Tep Paulita Fujita, M.D. Primary Care at [facility] previously [facility] [address] [place], [place]  [address] [phone] phone [phone] fax

## 2017-12-21 NOTE — Patient Instructions (Addendum)
IF you received an x-ray today, you will receive an invoice from [facility]. Please contact [facility] at [phone] with questions or concerns regarding your invoice.   IF you received labwork today, you will receive an invoice from [facility]. Please contact [facility] at [phone] with questions or concerns regarding your invoice.   Our billing staff will not be able to assist you with questions regarding bills from these companies.  You will be contacted with the lab results as soon as they are available. The fastest way to get your results is to activate your My Chart account. Instructions are located on the last page of this paperwork. If you have not heard from [place] regarding the results in 2 weeks, please contact this office.     preve Preventive Care 40-64 Years, Male Preventive care refers to lifestyle choices and visits with your health care provider that can promote health and wellness. What does preventive care include?  A yearly physical exam. This is also called an annual well check.  Dental exams once or twice a year.  Routine eye exams. Ask your health care provider how often you should have your eyes checked.  Personal lifestyle choices, including: ? Daily care of your teeth and gums. ? Regular physical activity. ? Eating a healthy diet. ? Avoiding tobacco and drug use. ? Limiting alcohol use. ? Practicing safe sex. ? Taking low-dose aspirin every day starting at age 57. What happens during an annual well check? The services and screenings done by your health care provider during your annual well check will depend on your age, overall health, lifestyle risk factors, and family history of disease. Counseling Your health care provider may ask you questions about your:  Alcohol use.  Tobacco use.  Drug use.  Emotional well-being.  Home and relationship well-being.  Sexual activity.  Eating habits.  Work and work  Statistician.  Screening You may have the following tests or measurements:  Height, weight, and BMI.  Blood pressure.  Lipid and cholesterol levels. These may be checked every 5 years, or more frequently if you are over 48 years old.  Skin check.  Lung cancer screening. You may have this screening every year starting at age 57 if you have a 30-pack-year history of smoking and currently smoke or have quit within the past 15 years.  Fecal occult blood test (FOBT) of the stool. You may have this test every year starting at age 57.  Flexible sigmoidoscopy or colonoscopy. You may have a sigmoidoscopy every 5 years or a colonoscopy every 10 years starting at age 57.  Prostate cancer screening. Recommendations will vary depending on your family history and other risks.  Hepatitis C blood test.  Hepatitis B blood test.  Sexually transmitted disease (STD) testing.  Diabetes screening. This is done by checking your blood sugar (glucose) after you have not eaten for a while (fasting). You may have this done every 1-3 years.  Discuss your test results, treatment options, and if necessary, the need for more tests with your health care provider. Vaccines Your health care provider may recommend certain vaccines, such as:  Influenza vaccine. This is recommended every year.  Tetanus, diphtheria, and acellular pertussis (Tdap, Td) vaccine. You may need a Td booster every 10 years.  Varicella vaccine. You may need this if you have not been vaccinated.  Zoster vaccine. You may need this after age 57.  Measles, mumps, and rubella (MMR) vaccine. You may need at least one dose  of MMR if you were born in 1957 or later. You may also need a second dose.  Pneumococcal 13-valent conjugate (PCV13) vaccine. You may need this if you have certain conditions and have not been vaccinated.  Pneumococcal polysaccharide (PPSV23) vaccine. You may need one or two doses if you smoke cigarettes or if you have  certain conditions.  Meningococcal vaccine. You may need this if you have certain conditions.  Hepatitis A vaccine. You may need this if you have certain conditions or if you travel or work in places where you may be exposed to hepatitis A.  Hepatitis B vaccine. You may need this if you have certain conditions or if you travel or work in places where you may be exposed to hepatitis B.  Haemophilus influenzae type b (Hib) vaccine. You may need this if you have certain risk factors.  Talk to your health care provider about which screenings and vaccines you need and how often you need them. This information is not intended to replace advice given to you by your health care provider. Make sure you discuss any questions you have with your health care provider. Document Released: 11/02/2015 Document Revised: 06/25/2016 Document Reviewed: 08/07/2015 Elsevier Interactive Patient Education  [employer].

## 2017-12-22 LAB — CBC WITH DIFFERENTIAL/PLATELET
BASOS: 1 %
Basophils Absolute: 0 10*3/uL (ref 0.0–0.2)
EOS (ABSOLUTE): 0.2 10*3/uL (ref 0.0–0.4)
EOS: 3 %
HEMATOCRIT: 46.8 % (ref 37.5–51.0)
HEMOGLOBIN: 15.8 g/dL (ref 13.0–17.7)
Immature Grans (Abs): 0 10*3/uL (ref 0.0–0.1)
Immature Granulocytes: 0 %
LYMPHS ABS: 2.3 10*3/uL (ref 0.7–3.1)
Lymphs: 42 %
MCH: 29.6 pg (ref 26.6–33.0)
MCHC: 33.8 g/dL (ref 31.5–35.7)
MCV: 88 fL (ref 79–97)
MONOCYTES: 12 %
MONOS ABS: 0.6 10*3/uL (ref 0.1–0.9)
NEUTROS ABS: 2.2 10*3/uL (ref 1.4–7.0)
Neutrophils: 42 %
Platelets: 202 10*3/uL (ref 150–379)
RBC: 5.34 x10E6/uL (ref 4.14–5.80)
RDW: 13.4 % (ref 12.3–15.4)
WBC: 5.3 10*3/uL (ref 3.4–10.8)

## 2017-12-22 LAB — COMPREHENSIVE METABOLIC PANEL
ALBUMIN: 4.7 g/dL (ref 3.5–5.5)
ALK PHOS: 57 IU/L (ref 39–117)
ALT: 18 IU/L (ref 0–44)
AST: 24 IU/L (ref 0–40)
Albumin/Globulin Ratio: 1.4 (ref 1.2–2.2)
BUN / CREAT RATIO: 13 (ref 9–20)
BUN: 16 mg/dL (ref 6–24)
Bilirubin Total: 0.5 mg/dL (ref 0.0–1.2)
CO2: 24 mmol/L (ref 20–29)
CREATININE: 1.22 mg/dL (ref 0.76–1.27)
Calcium: 9.4 mg/dL (ref 8.7–10.2)
Chloride: 101 mmol/L (ref 96–106)
GFR calc Af Amer: 76 mL/min/{1.73_m2} (ref >59)
GFR calc non Af Amer: 66 mL/min/{1.73_m2} (ref >59)
GLOBULIN, TOTAL: 3.3 g/dL (ref 1.5–4.5)
Glucose: 101 mg/dL — ABNORMAL HIGH (ref 65–99)
Potassium: 4.4 mmol/L (ref 3.5–5.2)
SODIUM: 139 mmol/L (ref 134–144)
Total Protein: 8 g/dL (ref 6.0–8.5)

## 2017-12-22 LAB — HEMOGLOBIN A1C
ESTIMATED AVERAGE GLUCOSE: 108 mg/dL
HEMOGLOBIN A1C: 5.4 % (ref 4.8–5.6)

## 2017-12-22 LAB — LIPID PANEL
CHOL/HDL RATIO: 3.8 ratio (ref 0.0–5.0)
Cholesterol, Total: 220 mg/dL — ABNORMAL HIGH (ref 100–199)
HDL: 58 mg/dL (ref >39)
LDL Calculated: 136 mg/dL — ABNORMAL HIGH (ref 0–99)
Triglycerides: 132 mg/dL (ref 0–149)
VLDL CHOLESTEROL CAL: 26 mg/dL (ref 5–40)

## 2017-12-22 LAB — PSA: PROSTATE SPECIFIC AG, SERUM: 1.5 ng/mL (ref 0.0–4.0)

## 2017-12-22 LAB — TSH: TSH: 3.34 u[IU]/mL (ref 0.450–4.500)

## 2017-12-23 ENCOUNTER — Encounter: Payer: 59 | Admitting: Family Medicine

## 2018-01-27 ENCOUNTER — Telehealth: Payer: Self-pay

## 2018-01-27 NOTE — Telephone Encounter (Signed)
Copied from CRM [phone]. Topic: Bill or Statement - Patient/Guarantor Inquiry >> Jan 18, 2018 10:10 AM Maia Pettiesrtiz, Kristie S wrote: Pt calling about bill from [facility] received 12/21/17. Pt had annual physicial and states that our coding wasn't filed correctly so insurance did not pay.  [facility] states the codes submitted were Z13.1, Z13.220, and Z12.5 Pt is requesting a call back to discuss how we coded and see if it needs resubmitted/recoded. >> Jan 18, 2018 12:13 PM Samule DryBarnes, Amy H wrote: Message sent to office also.  Coding pool is unable to change lab diagnosis coding. >> Jan 27, 2018 10:27 AM Arlyss Gandyichardson, Taren N, NT wrote: Pt calling to check status on having the codes changed for his last visit. [facility] and for the reading of his EKG and this should have been covered under his insurance for his physical per pt. Please call pt to advise.

## 2018-03-15 ENCOUNTER — Encounter: Payer: Self-pay | Admitting: Family Medicine

## 2018-08-10 ENCOUNTER — Encounter: Payer: Self-pay | Admitting: Family Medicine

## 2022-07-18 DIAGNOSIS — Z13220 Encounter for screening for lipoid disorders: Secondary | ICD-10-CM | POA: Diagnosis not present

## 2022-07-18 DIAGNOSIS — Z23 Encounter for immunization: Secondary | ICD-10-CM | POA: Diagnosis not present

## 2022-07-18 DIAGNOSIS — Z0000 Encounter for general adult medical examination without abnormal findings: Secondary | ICD-10-CM | POA: Diagnosis not present

## 2022-07-18 DIAGNOSIS — Z125 Encounter for screening for malignant neoplasm of prostate: Secondary | ICD-10-CM | POA: Diagnosis not present

## 2023-07-22 DIAGNOSIS — Z23 Encounter for immunization: Secondary | ICD-10-CM | POA: Diagnosis not present

## 2023-11-12 DIAGNOSIS — Z125 Encounter for screening for malignant neoplasm of prostate: Secondary | ICD-10-CM | POA: Diagnosis not present

## 2023-11-12 DIAGNOSIS — Z23 Encounter for immunization: Secondary | ICD-10-CM | POA: Diagnosis not present

## 2023-11-12 DIAGNOSIS — Z0000 Encounter for general adult medical examination without abnormal findings: Secondary | ICD-10-CM | POA: Diagnosis not present

## 2023-11-12 DIAGNOSIS — Z13220 Encounter for screening for lipoid disorders: Secondary | ICD-10-CM | POA: Diagnosis not present

## 2024-08-10 DIAGNOSIS — Z23 Encounter for immunization: Secondary | ICD-10-CM | POA: Diagnosis not present
